# Patient Record
Sex: Female | Born: 1980 | Race: White | Hispanic: No | Marital: Married | Smoking: Never smoker
Health system: Southern US, Community
[De-identification: ages and names within clinical notes are randomized; demographics above are authoritative.]

## PROBLEM LIST (undated history)

## (undated) DIAGNOSIS — D649 Anemia, unspecified: Secondary | ICD-10-CM

## (undated) DIAGNOSIS — E559 Vitamin D deficiency, unspecified: Secondary | ICD-10-CM

## (undated) DIAGNOSIS — F429 Obsessive-compulsive disorder, unspecified: Secondary | ICD-10-CM

## (undated) DIAGNOSIS — R011 Cardiac murmur, unspecified: Secondary | ICD-10-CM

## (undated) DIAGNOSIS — F419 Anxiety disorder, unspecified: Secondary | ICD-10-CM

## (undated) DIAGNOSIS — K5090 Crohn's disease, unspecified, without complications: Secondary | ICD-10-CM

## (undated) HISTORY — DX: Anemia, unspecified: D64.9

## (undated) HISTORY — DX: Cardiac murmur, unspecified: R01.1

## (undated) HISTORY — DX: Anxiety disorder, unspecified: F41.9

## (undated) HISTORY — DX: Obsessive-compulsive disorder, unspecified: F42.9

## (undated) HISTORY — DX: Vitamin D deficiency, unspecified: E55.9

## (undated) HISTORY — DX: Crohn's disease, unspecified, without complications: K50.90

---

## 2002-10-07 HISTORY — PX: COLONOSCOPY: SHX174

## 2019-10-21 ENCOUNTER — Encounter: Payer: Self-pay | Admitting: Gastroenterology

## 2019-10-25 ENCOUNTER — Encounter: Payer: Self-pay | Admitting: Emergency Medicine

## 2019-10-26 ENCOUNTER — Other Ambulatory Visit (INDEPENDENT_AMBULATORY_CARE_PROVIDER_SITE_OTHER): Payer: Self-pay

## 2019-10-26 ENCOUNTER — Encounter: Payer: Self-pay | Admitting: Gastroenterology

## 2019-10-26 ENCOUNTER — Ambulatory Visit: Payer: Self-pay | Admitting: Gastroenterology

## 2019-10-26 VITALS — BP 114/62 | HR 80 | Temp 97.6°F | Ht 61.0 in | Wt 153.6 lb

## 2019-10-26 DIAGNOSIS — K50919 Crohn's disease, unspecified, with unspecified complications: Secondary | ICD-10-CM

## 2019-10-26 DIAGNOSIS — D509 Iron deficiency anemia, unspecified: Secondary | ICD-10-CM

## 2019-10-26 DIAGNOSIS — Z01818 Encounter for other preprocedural examination: Secondary | ICD-10-CM

## 2019-10-26 LAB — C-REACTIVE PROTEIN: CRP: <1 mg/dL (ref 0.5–20.0)

## 2019-10-26 LAB — SEDIMENTATION RATE: Sed Rate: 38 mm/hr — ABNORMAL HIGH (ref 0–20)

## 2019-10-26 MED ORDER — NA SULFATE-K SULFATE-MG SULF 17.5-3.13-1.6 GM/177ML PO SOLN: 1.0000 | ORAL | 0 refills | Status: DC

## 2019-10-26 NOTE — Progress Notes (Signed)
Referring Provider: Clydie Braun T Primary Care Physician:  Leilani Able, FNP  Reason for Consultation:  Crohn's disease   IMPRESSION:  Crohn's disease     - diagnosed at age 39    - not on treatment in over 15 years    - no history of extra-GI manifestations Change in bowel habits x 1 years Iron deficiency anemia without overt GI blood loss Regular NSAID use  History of Crohn's with loose stools. No blood. No thrombocytosis or hypoalbuminemia. Liver enzymes were normal. Recommend blood work, stool studies, and endoscopy for staging. Will review prior records from [facility]. If necessary, will proceed with CTE. Recommended that she avoid all NSAIDs.  Treatment options will need to be carefully considered given her lack of insurance. Will include pretreatment screening labs to determine her candidacy for biologics.   Profound iron deficiency anemia. Likely due to Crohn's, however, endoscopic evaluation with EGD and colonoscopy recommended to confirm the diagnosis and exclude concurrent etiologies. Recommend IV iron. Continue oral iron in the meantime.   Need to address preventative recommendations after confirming active disease.     PLAN: - ESR, CRP, fecal calprotectin, GI stool pathogen - HBsAg, HBcAb IgM, Quanterferon Gold - Feraheme day 0 and day 7 - Avoid all NSAIDs - EGD and Colonoscopy - Obtain records from [place] office visit after endoscopy  The nature of the procedure, as well as the risks, benefits, and alternatives were carefully and thoroughly reviewed with the patient. Ample time for discussion and questions allowed. The patient understood, was satisfied, and agreed to proceed.  Please see the "Patient Instructions" section for addition details about the plan.  HPI: Kelly Bryant is a 39 y.o. female referred by NP Alroy Dust for Crohn's disease. The history is obtained through the patient and referral  records provided by NP [facility]. She has anxiety, OCD,  and panic attacks. 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 Now home with her kids. Hours changed at work and she was unable to care for her kids and work.   Crohn's diagnosed at age 34 presenting with bloody diarrhea.  Had at least 13 colonoscopies.  No extra-GI manifestations of IBD Treated with folic acid, azathioprine, prednisone. Lost insurance around 83. Has not had any care or medical treatment since then. She was having constipation for many years. Then, a marked change in her bowel habits.  No formed BM in the last couple of years. Having 2-3 BM, post-prandial, urgent bowel movements at baseline. Increased frequency to 5-6 if she eats spicy foods.  No blood or mucous in the stools.  Mild, associated, non-radiating RLQ tenderness.  Finds Crohns symptoms are worse during menses. Reports heavy bleeding with menses.   No other identified exacerbating or relieving features.   PCP started her on budesonide 9 mg daily and ferrous sulfate. Could not afford the budesonide.  Stools are black since starting iron.  Uses Pamprin for pain, Aleve for leg cramps once or twice monthly.   Has a 37 year old and a 38 year old. Not planning to have any additional children.   Has not had the flu vaccine. No HAV, HBV, Pneumovax, Shingles, or HPV vaccines.  Labs 10/21/19: WBC 5.4, hgb 6.8, MCV 51, RDW 16.8, platelets 323; Normal CMP with normal liver enzymes; TSH 1.739  No known family history of colon cancer or polyps. No family history of uterine/endometrial cancer, pancreatic cancer or gastric/stomach cancer.   Past Medical History:  Diagnosis Date  . Anemia   . Anxiety   . Crohn's disease ([place])   . Heart murmur   . OCD (obsessive compulsive disorder)     Past Surgical History:  Procedure Laterality Date  . CESAREAN SECTION      Current Outpatient Medications  Medication Sig Dispense Refill  . FLUoxetine (PROZAC) 10 MG  capsule Take 10 mg by mouth at bedtime.    . ferrous sulfate 325 (65 FE) MG tablet Take 325 mg by mouth 3 (three) times daily with meals.    . Na Sulfate-K Sulfate-Mg Sulf 17.5-3.13-1.6 GM/177ML SOLN Take 1 kit by mouth as directed. 354 mL 0   No current facility-administered medications for this visit.    Allergies as of 10/26/2019  . (No Known Allergies)    Family History  Problem Relation Age of Onset  . Esophageal cancer Neg Hx   . Colon cancer Neg Hx   . Pancreatic cancer Neg Hx   . Stomach cancer Neg Hx   . Liver disease Neg Hx   . Inflammatory bowel disease Neg Hx     Social History   Socioeconomic History  . Marital status: Married    Spouse name: Not on file  . Number of children: Not on file  . Years of education: Not on file  . Highest education level: Not on file  Occupational History  . Not on file  Tobacco Use  . Smoking status: Never Smoker  . Smokeless tobacco: Never Used  Substance and Sexual Activity  . Alcohol use: Not Currently  . Drug use: Never  . Sexual activity: Not on file  Other Topics Concern  . Not on file  Social History Narrative  . Not on file   Social Determinants of Health   Financial Resource Strain:   . Difficulty of Paying Living Expenses: Not on file  Food Insecurity:   . Worried About Charity fundraiser in the Last Year: Not on file  . Ran Out of Food in the Last Year: Not on file  Transportation Needs:   . Lack of Transportation (Medical): Not on file  . Lack of Transportation (Non-Medical): Not on file  Physical Activity:   . Days of Exercise per Week: Not on file  . Minutes of Exercise per Session: Not on file  Stress:   . Feeling of Stress : Not on file  Social Connections:   . Frequency of Communication with Friends and Family: Not on file  . Frequency of Social Gatherings with Friends and Family: Not on file  . Attends Religious Services: Not on file  . Active Member of Clubs or Organizations: Not on file  .  Attends Archivist Meetings: Not on file  . Marital Status: Not on file  Intimate Partner Violence:   . Fear of Current or Ex-Partner: Not on file  . Emotionally Abused: Not on file  . Physically Abused: Not on file  . Sexually Abused: Not on file    Review of Systems: 12 system ROS is negative except as noted above.   Physical Exam: General:   Alert,  well-nourished, pleasant and cooperative in NAD Head:  Normocephalic and atraumatic. Eyes:  Sclera clear, no icterus.   Conjunctiva pink. Ears:  Normal auditory acuity. Nose:  No deformity, discharge,  or lesions. Mouth:  No deformity or lesions.   Neck:  Supple; no masses or thyromegaly. Lungs:  Clear throughout to auscultation.   No wheezes. Heart:  Regular rate and  rhythm; no murmurs. Abdomen:  Soft,nontender, nondistended, normal bowel sounds, no rebound or guarding. No hepatosplenomegaly.   Rectal:  Deferred  Msk:  Symmetrical. No boney deformities LAD: No inguinal or umbilical LAD Extremities:  No clubbing or edema. Neurologic:  Alert and  oriented x4;  grossly nonfocal Skin:  Intact without significant lesions or rashes. Psych:  Alert and cooperative. Normal mood and affect.    Orlinda Slomski L. Tarri Glenn, MD, MPH 10/26/2019, 9:29 PM

## 2019-10-26 NOTE — Patient Instructions (Addendum)
I have recommended some stool tests, blood tests, and endoscopy to stage your Crohn's and help [place] determine the best medications for treatment. Please go to the basement for the labwork.   Continue to take your oral iron. I am recommending IV iron given how low your iron stores are. Your appointment is scheduled for Thursday 10-28-19 at 8 am.   I recommend that you use sunscreen and sun-protective clothing whenever you are outside.  Avoid all non-steroidal anti-inflammatory medications such as Aleve, as this can worsen inflammatory bowel disease.   Please get the annual flu vaccine and ideally the Covid vaccine prior to your follow-up appointment.   Please see your gynecologist annually for cervical cancer screening  Keeping your bones healthy is important. I recommend that you take daily calcium and Vitamin D supplements. Weight bearing exercise is also good for your bones.   I recommend that you go to the [employer] website for additional information about achieving good health.

## 2019-10-28 ENCOUNTER — Other Ambulatory Visit: Payer: Self-pay

## 2019-10-28 ENCOUNTER — Ambulatory Visit (HOSPITAL_COMMUNITY)
Admission: RE | Admit: 2019-10-28 | Discharge: 2019-10-28 | Disposition: A | Discharge status: Home / Self Care | Payer: Self-pay | Source: Ambulatory Visit | Attending: Internal Medicine | Admitting: Internal Medicine

## 2019-10-28 DIAGNOSIS — D509 Iron deficiency anemia, unspecified: Secondary | ICD-10-CM

## 2019-10-28 DIAGNOSIS — K50919 Crohn's disease, unspecified, with unspecified complications: Secondary | ICD-10-CM

## 2019-10-28 LAB — QUANTIFERON-TB GOLD PLUS
Mitogen-NIL: >10 IU/mL
NIL: 0.04 IU/mL
QuantiFERON-TB Gold Plus: NEGATIVE
TB1-NIL: 0 IU/mL
TB2-NIL: <0 IU/mL

## 2019-10-28 LAB — HEPATITIS B SURFACE ANTIGEN: Hepatitis B Surface Ag: NONREACTIVE

## 2019-10-28 LAB — HEPATITIS B CORE ANTIBODY, IGM: Hep B C IgM: NONREACTIVE

## 2019-10-28 MED ORDER — SODIUM CHLORIDE 0.9 % IV SOLN
510.0000 mg | INTRAVENOUS | Status: DC
  Administered 2019-10-28: 09:00:00 510 mg via INTRAVENOUS
  Filled 2019-10-28: qty 17

## 2019-10-28 MED ORDER — SODIUM CHLORIDE 0.9 % IV SOLN
INTRAVENOUS | Status: DC | PRN
  Administered 2019-10-28: 250 mL via INTRAVENOUS

## 2019-10-28 NOTE — Discharge Instructions (Signed)

## 2019-10-28 NOTE — Progress Notes (Signed)
PATIENT CARE CENTER NOTE  Diagnosis:  Iron deficiency anemia, unspecified iron deficiency anemia type (D50.9)   Provider: Tressia Danas, MD   Procedure: IV Feraheme   Note: Patient received Feraheme infusion via PIV. Tolerated infusion well with no adverse reaction. Observed patient for 30 minutes post-infusion. Vital signs stable. Discharge instructions given. Patient to come back next week for second infusion. Alert, oriented and ambulatory at discharge.

## 2019-11-02 LAB — GASTROINTESTINAL PATHOGEN PANEL PCR
C. difficile Tox A/B, PCR: NOT DETECTED
Campylobacter, PCR: NOT DETECTED
Cryptosporidium, PCR: NOT DETECTED
E coli (ETEC) LT/ST PCR: NOT DETECTED
E coli (STEC) stx1/stx2, PCR: NOT DETECTED
E coli 0157, PCR: NOT DETECTED
Giardia lamblia, PCR: NOT DETECTED
Norovirus, PCR: NOT DETECTED
Rotavirus A, PCR: NOT DETECTED
Salmonella, PCR: NOT DETECTED
Shigella, PCR: NOT DETECTED

## 2019-11-04 ENCOUNTER — Other Ambulatory Visit: Payer: Self-pay

## 2019-11-04 ENCOUNTER — Ambulatory Visit (HOSPITAL_COMMUNITY)
Admission: RE | Admit: 2019-11-04 | Discharge: 2019-11-04 | Disposition: A | Discharge status: Home / Self Care | Payer: Self-pay | Source: Ambulatory Visit | Attending: Internal Medicine | Admitting: Internal Medicine

## 2019-11-04 LAB — CALPROTECTIN, FECAL: Calprotectin, Fecal: 718 ug/g — ABNORMAL HIGH (ref 0–120)

## 2019-11-04 MED ORDER — SODIUM CHLORIDE 0.9 % IV SOLN
INTRAVENOUS | Status: DC | PRN
  Administered 2019-11-04: 250 mL via INTRAVENOUS

## 2019-11-04 MED ORDER — SODIUM CHLORIDE 0.9 % IV SOLN
510.0000 mg | Freq: Once | INTRAVENOUS | Status: AC
  Administered 2019-11-04: 510 mg via INTRAVENOUS
  Filled 2019-11-04: qty 17

## 2019-11-04 NOTE — Progress Notes (Addendum)
Patient Care Center Note  Diagnosis: Iron deficiency anemia, unspecified iron deficiency anemia type (D50.9)  Provider: Tressia Danas, MD  Procedure: IV Feraheme  Note: Patient received IV feraheme infusion today via PIV. Pt tolerated infusion well, no adverse reaction noted. Observed patient for 30 minutes post transfusion, vital signs stable, IV removed without difficulty, RN reviewed discharge instructions with patient, all questions answered, pt A&Ox4 and ambulatory at time of discharge.

## 2019-11-04 NOTE — Discharge Instructions (Signed)

## 2019-11-10 ENCOUNTER — Other Ambulatory Visit: Payer: Self-pay | Admitting: Gastroenterology

## 2019-11-10 ENCOUNTER — Ambulatory Visit (INDEPENDENT_AMBULATORY_CARE_PROVIDER_SITE_OTHER): Payer: Self-pay

## 2019-11-10 DIAGNOSIS — Z1159 Encounter for screening for other viral diseases: Secondary | ICD-10-CM

## 2019-11-10 LAB — SARS CORONAVIRUS 2 (TAT 6-24 HRS): SARS Coronavirus 2: NEGATIVE

## 2019-11-12 ENCOUNTER — Other Ambulatory Visit: Payer: Self-pay

## 2019-11-12 ENCOUNTER — Ambulatory Visit (AMBULATORY_SURGERY_CENTER): Payer: Self-pay | Admitting: Gastroenterology

## 2019-11-12 ENCOUNTER — Encounter: Payer: Self-pay | Admitting: Gastroenterology

## 2019-11-12 VITALS — BP 103/55 | HR 50 | Temp 97.7°F | Resp 10 | Ht 61.0 in | Wt 153.0 lb

## 2019-11-12 DIAGNOSIS — K50919 Crohn's disease, unspecified, with unspecified complications: Secondary | ICD-10-CM

## 2019-11-12 DIAGNOSIS — K259 Gastric ulcer, unspecified as acute or chronic, without hemorrhage or perforation: Secondary | ICD-10-CM

## 2019-11-12 DIAGNOSIS — D509 Iron deficiency anemia, unspecified: Secondary | ICD-10-CM

## 2019-11-12 DIAGNOSIS — K3189 Other diseases of stomach and duodenum: Secondary | ICD-10-CM

## 2019-11-12 DIAGNOSIS — K2950 Unspecified chronic gastritis without bleeding: Secondary | ICD-10-CM

## 2019-11-12 DIAGNOSIS — K2980 Duodenitis without bleeding: Secondary | ICD-10-CM

## 2019-11-12 HISTORY — PX: COLONOSCOPY: SHX174

## 2019-11-12 MED ORDER — SODIUM CHLORIDE 0.9 % IV SOLN: 500.0000 mL | Freq: Once | INTRAVENOUS | Status: DC

## 2019-11-12 MED ORDER — PANTOPRAZOLE SODIUM 40 MG PO TBEC: 40.0000 mg | DELAYED_RELEASE_TABLET | Freq: Every day | ORAL | 0 refills | Status: AC

## 2019-11-12 NOTE — Progress Notes (Signed)
Called to room to assist during endoscopic procedure.  Patient ID and intended procedure confirmed with present staff. Received instructions for my participation in the procedure from the performing physician.  

## 2019-11-12 NOTE — Progress Notes (Signed)
Pt's states no medical or surgical changes since previsit or office visit.  Temp LC Vitals DT 

## 2019-11-12 NOTE — Patient Instructions (Signed)
Start Protonix 40mg  daily before breakfast for 8 weeks.  Avoid all non-steroidal anti-inflammatory drugs (NSAIDs).   Handouts provided on gastritis and NSAIDs and peptic ulcers.    YOU HAD AN ENDOSCOPIC PROCEDURE TODAY AT THE [facility]:   Refer to the procedure report that was given to you for any specific questions about what was found during the examination.  If the procedure report does not answer your questions, please call your gastroenterologist to clarify.  If you requested that your care partner not be given the details of your procedure findings, then the procedure report has been included in a sealed envelope for you to review at your convenience later.  YOU SHOULD EXPECT: Some feelings of bloating in the abdomen. Passage of more gas than usual.  Walking can help get rid of the air that was put into your GI tract during the procedure and reduce the bloating. If you had a lower endoscopy (such as a colonoscopy or flexible sigmoidoscopy) you may notice spotting of blood in your stool or on the toilet paper. If you underwent a bowel prep for your procedure, you may not have a normal bowel movement for a few days.  Please Note:  You might notice some irritation and congestion in your nose or some drainage.  This is from the oxygen used during your procedure.  There is no need for concern and it should clear up in a day or so.  SYMPTOMS TO REPORT IMMEDIATELY:   Following lower endoscopy (colonoscopy or flexible sigmoidoscopy):  Excessive amounts of blood in the stool  Significant tenderness or worsening of abdominal pains  Swelling of the abdomen that is new, acute  Fever of 100F or higher   Following upper endoscopy (EGD)  Vomiting of blood or coffee ground material  New chest pain or pain under the shoulder blades  Painful or persistently difficult swallowing  New shortness of breath  Fever of 100F or higher  Black, tarry-looking stools  For urgent or emergent  issues, a gastroenterologist can be reached at any hour by calling [phone].   DIET:  We do recommend a small meal at first, but then you may proceed to your regular diet.  Drink plenty of fluids but you should avoid alcoholic beverages for 24 hours.  ACTIVITY:  You should plan to take it easy for the rest of today and you should NOT DRIVE or use heavy machinery until tomorrow (because of the sedation medicines used during the test).    FOLLOW UP: Our staff will call the number listed on your records 48-72 hours following your procedure to check on you and address any questions or concerns that you may have regarding the information given to you following your procedure. If we do not reach you, we will leave a message.  We will attempt to reach you two times.  During this call, we will ask if you have developed any symptoms of COVID 19. If you develop any symptoms (ie: fever, flu-like symptoms, shortness of breath, cough etc.) before then, please call [phone].  If you test positive for Covid 19 in the 2 weeks post procedure, please call and report this information to [phone].    If any biopsies were taken you will be contacted by phone or by letter within the next 1-3 weeks.  Please call us at [phone] if you have not heard about the biopsies in 3 weeks.    SIGNATURES/CONFIDENTIALITY: You and/or your care partner have signed paperwork which  will be entered into your electronic medical record.  These signatures attest to the fact that that the information above on your After Visit Summary has been reviewed and is understood.  Full responsibility of the confidentiality of this discharge information lies with you and/or your care-partner.

## 2019-11-12 NOTE — Op Note (Signed)
[facility] Patient Name: Kelly Bryant Procedure Date: 11/12/2019 1:59 PM MRN: [id number] Endoscopist: Thornton Park MD, MD Age: 39 Referring MD:  Date of Birth: June 16, 1981 Gender: Female Account #: [id number] Procedure:                Colonoscopy Indications:              Unexplained iron deficiency anemia, Follow-up of                            Crohn's disease of the colon                           Crohn's disease                           - diagnosed at age 55                           - not on treatment in over 54 years                           - no history of extra-GI manifestations                           Change in bowel habits x 1 years                           Iron deficiency anemia without overt GI blood loss                           Regular NSAID use Medicines:                Monitored Anesthesia Care Procedure:                Pre-Anesthesia Assessment:                           - Prior to the procedure, a History and Physical                            was performed, and patient medications and                            allergies were reviewed. The patient's tolerance of                            previous anesthesia was also reviewed. The risks                            and benefits of the procedure and the sedation                            options and risks were discussed with the patient.                            All questions were answered, and informed consent  was obtained. Prior Anticoagulants: The patient has                            taken no previous anticoagulant or antiplatelet                            agents. ASA Grade Assessment: II - A patient with                            mild systemic disease. After reviewing the risks                            and benefits, the patient was deemed in                            satisfactory condition to undergo the procedure.                           After obtaining  informed consent, the colonoscope                            was passed under direct vision. Throughout the                            procedure, the patient's blood pressure, pulse, and                            oxygen saturations were monitored continuously. The                            Colonoscope was introduced through the anus and                            advanced to the 1 cm into the ileum. A second                            forward view of the right colon. The colonoscopy                            was performed without difficulty. The patient                            tolerated the procedure well. The quality of the                            bowel preparation was good. Scope In: 2:16:07 PM Scope Out: 2:32:05 PM Scope Withdrawal Time: 0 hours 14 minutes 2 seconds  Total Procedure Duration: 0 hours 15 minutes 58 seconds  Findings:                 The perianal and digital rectal examinations were                            normal.  The colon (entire examined portion) appeared                            normal. Biopsies were taken from the cecum, right                            colon, transverse, left colon and rectum with a                            cold forceps for histology. Estimated blood loss                            was minimal.                           The terminal ileum was fixed. I was unable to                            evaluate beyond the most distal TI. The distal                            mucosa in the terminal ileum was friable with                            contact bleeding. Biopsies were taken with a cold                            forceps for histology. Estimated blood loss was                            minimal.                           The exam was otherwise without abnormality on                            direct and retroflexion views. Complications:            No immediate complications. Estimated blood loss:                             Minimal. Estimated Blood Loss:     Estimated blood loss was minimal. Impression:               - The entire examined colon is normal. Biopsied.                           - Friability with contact bleeding in the terminal                            ileum. Biopsied.                           - The examination was otherwise normal on direct  and retroflexion views. Recommendation:           - Patient has a contact number available for                            emergencies. The signs and symptoms of potential                            delayed complications were discussed with the                            patient. Return to normal activities tomorrow.                            Written discharge instructions were provided to the                            patient.                           - Resume previous diet.                           - Continue present medications.                           - Await pathology results.                           - Repeat colonoscopy date to be determined after                            pending pathology results are reviewed for                            surveillance.                           - Follow-up in the office to review these results. Tressia Danas MD, MD 11/12/2019 2:52:34 PM This report has been signed electronically.

## 2019-11-12 NOTE — Op Note (Signed)
[facility] Patient Name: Kelly Bryant Procedure Date: 11/12/2019 2:00 PM MRN: [id number] Endoscopist: Tressia Danas MD, MD Age: 39 Referring MD:  Date of Birth: [birth year] Gender: Female Account #: [id number] Procedure:                Upper GI endoscopy Indications:              Unexplained iron deficiency anemia Medicines:                Monitored Anesthesia Care Procedure:                Pre-Anesthesia Assessment:                           - Prior to the procedure, a History and Physical                            was performed, and patient medications and                            allergies were reviewed. The patient's tolerance of                            previous anesthesia was also reviewed. The risks                            and benefits of the procedure and the sedation                            options and risks were discussed with the patient.                            All questions were answered, and informed consent                            was obtained. Prior Anticoagulants: The patient has                            taken no previous anticoagulant or antiplatelet                            agents. ASA Grade Assessment: II - A patient with                            mild systemic disease. After reviewing the risks                            and benefits, the patient was deemed in                            satisfactory condition to undergo the procedure.                           After obtaining informed consent, the endoscope was  passed under direct vision. Throughout the                            procedure, the patient's blood pressure, pulse, and                            oxygen saturations were monitored continuously. The                            Endoscope was introduced through the mouth, and                            advanced to the third part of duodenum. The upper                            GI endoscopy was  accomplished without difficulty.                            The patient tolerated the procedure well. Scope In: Scope Out: Findings:                 The esophagus was normal.                           One non-bleeding superficial gastric ulcer with no                            stigmata of bleeding was found in the gastric                            antrum. The lesion was 6 mm in largest dimension.                            Biopsies were taken with a cold forceps for                            histology. Estimated blood loss was minimal.                           Diffuse mild inflammation characterized by                            erythema, friability and granularity was found in                            the gastric body and in the gastric antrum.                            Biopsies were taken with a cold forceps for                            histology. Estimated blood loss was minimal.  Diffuse mildly erythematous mucosa without active                            bleeding and with no stigmata of bleeding was found                            in the duodenal bulb. Biopsies were taken with a                            cold forceps for histology. Estimated blood loss                            was minimal. Complications:            No immediate complications. Estimated blood loss:                            Minimal. Estimated Blood Loss:     Estimated blood loss was minimal. Impression:               - Normal esophagus.                           - Non-bleeding gastric ulcer with no stigmata of                            bleeding. Biopsied.                           - Gastritis. Biopsied.                           - Erythematous duodenopathy. Biopsied. Recommendation:           - Patient has a contact number available for                            emergencies. The signs and symptoms of potential                            delayed complications were discussed  with the                            patient. Return to normal activities tomorrow.                            Written discharge instructions were provided to the                            patient.                           - Resume previous diet.                           - Continue present medications. Start Protonix 40  mg QAM x 8 weeks.                           - Avoid all NSAIDs.                           - Await pathology results.                           - Proceed with colonoscopy as previously planned. Thornton Park MD, MD 11/12/2019 2:44:14 PM This report has been signed electronically.

## 2019-11-12 NOTE — Progress Notes (Signed)
To PACU, VSS. Report to [email or web address] 

## 2019-11-16 ENCOUNTER — Telehealth: Payer: Self-pay | Admitting: *Deleted

## 2019-11-16 NOTE — Telephone Encounter (Signed)
  Follow up Call-  Call back number 11/12/2019  Post procedure Call Back phone  # [phone]  Permission to leave phone message Yes    No answer for post procedure call back. Left message for patient to call with questions or concerns.

## 2019-11-24 ENCOUNTER — Other Ambulatory Visit: Payer: Self-pay | Admitting: *Deleted

## 2019-11-24 ENCOUNTER — Ambulatory Visit: Payer: Self-pay | Admitting: Gastroenterology

## 2019-11-24 DIAGNOSIS — K50919 Crohn's disease, unspecified, with unspecified complications: Secondary | ICD-10-CM

## 2019-12-02 ENCOUNTER — Other Ambulatory Visit: Payer: Self-pay | Admitting: *Deleted

## 2019-12-02 DIAGNOSIS — K50919 Crohn's disease, unspecified, with unspecified complications: Secondary | ICD-10-CM

## 2019-12-16 ENCOUNTER — Ambulatory Visit (HOSPITAL_COMMUNITY)
Admission: RE | Admit: 2019-12-16 | Discharge: 2019-12-16 | Disposition: A | Discharge status: Home / Self Care | Payer: Self-pay | Source: Ambulatory Visit | Attending: Gastroenterology | Admitting: Gastroenterology

## 2019-12-16 ENCOUNTER — Other Ambulatory Visit: Payer: Self-pay

## 2019-12-16 DIAGNOSIS — K50919 Crohn's disease, unspecified, with unspecified complications: Secondary | ICD-10-CM | POA: Insufficient documentation

## 2019-12-16 MED ORDER — SODIUM CHLORIDE (PF) 0.9 % IJ SOLN
INTRAMUSCULAR | Status: AC
  Filled 2019-12-16: qty 50

## 2019-12-16 MED ORDER — IOHEXOL 300 MG/ML  SOLN
100.0000 mL | Freq: Once | INTRAMUSCULAR | Status: AC | PRN
  Administered 2019-12-16: 100 mL via INTRAVENOUS

## 2019-12-16 MED ORDER — BARIUM SULFATE 0.1 % PO SUSP
ORAL | Status: AC
  Filled 2019-12-16: qty 3

## 2019-12-30 ENCOUNTER — Encounter: Payer: Self-pay | Admitting: Gastroenterology

## 2019-12-30 ENCOUNTER — Ambulatory Visit (INDEPENDENT_AMBULATORY_CARE_PROVIDER_SITE_OTHER): Payer: Self-pay | Admitting: Gastroenterology

## 2019-12-30 ENCOUNTER — Other Ambulatory Visit (INDEPENDENT_AMBULATORY_CARE_PROVIDER_SITE_OTHER): Payer: Self-pay

## 2019-12-30 VITALS — BP 94/60 | HR 80 | Temp 98.6°F | Ht 61.5 in | Wt 156.4 lb

## 2019-12-30 DIAGNOSIS — K50919 Crohn's disease, unspecified, with unspecified complications: Secondary | ICD-10-CM

## 2019-12-30 DIAGNOSIS — D509 Iron deficiency anemia, unspecified: Secondary | ICD-10-CM

## 2019-12-30 LAB — C-REACTIVE PROTEIN: CRP: <1 mg/dL (ref 0.5–20.0)

## 2019-12-30 LAB — CBC
HCT: 37.9 % (ref 36.0–46.0)
Hemoglobin: 12.7 g/dL (ref 12.0–15.0)
MCHC: 33.5 g/dL (ref 30.0–36.0)
MCV: 77.7 fl — ABNORMAL LOW (ref 78.0–100.0)
Platelets: 218 10*3/uL (ref 150.0–400.0)
RBC: 4.88 Mil/uL (ref 3.87–5.11)
RDW: 15.3 % (ref 11.5–15.5)
WBC: 6.8 10*3/uL (ref 4.0–10.5)

## 2019-12-30 LAB — SEDIMENTATION RATE: Sed Rate: 29 mm/hr — ABNORMAL HIGH (ref 0–20)

## 2019-12-30 LAB — IRON: Iron: 35 ug/dL — ABNORMAL LOW (ref 42–145)

## 2019-12-30 LAB — FERRITIN: Ferritin: 14 ng/mL (ref 10.0–291.0)

## 2019-12-30 MED ORDER — BUDESONIDE 3 MG PO CPEP: 9.0000 mg | ORAL_CAPSULE | Freq: Every day | ORAL | 1 refills | Status: AC

## 2019-12-30 NOTE — Progress Notes (Signed)
Referring Provider: Clydie Braun T Primary Care Physician:  Leilani Able, FNP  Chief complaint:  Crohn's disease   IMPRESSION:  Crohn's disease     - diagnosed at age 39    - not on treatment in over 29 years    - no history of extra-GI manifestations    - Labs 10/28/19: fecal calprotectin 718, ESR 38, CRP less than 1    - Colon/EGD showed biopsy-identified ileitis, normal colon biopsies    - CTE 12/16/19: distal TI irregularity and pseudosacculation; narrowing at the IC valve NSAID-related gastric ulcer    - biopsies negative for H pylori Change in bowel habits x 1 years Iron deficiency anemia without overt GI blood loss    -Symptomatic improvement with IV iron x2 Recent NSAID use Leiomyomata seen on recent CT scan  Crohn's disease: Unclear if recent symptoms and endoscopy and imaging findings reflect active IBD or NSAIDs. Ileal biopsies do not show classic features of IBD. She has stopped using all NSAIDs. Discussed trial of budesonide prior to considering other treatments. Also had a more extensive discussion about other Crohn's treatment options. Plan repeat colonoscopy with more extensive evaluation of the TI after NSAID washout and treatment with budesonide. We have been unable to locate old records from [facility] including pathology results from the time of her initial diagnosis  Profound iron deficiency anemia. Repeat labs today to follow her response to two IV infusions. Referral to GYN in the event that the leiomyomata on CT is contributing to her anemia and that the anemia is not due to Crohn's. Continue oral iron at this time. Suspect she will need additional IV iron  Need to address preventative recommendations after confirming active disease.   Gastric ulcer: Off NSAIDs. Completed PPI therapy. Consider follow-up EGD to document healing.    PLAN: - ESR, CRP, fecal calprotectin - Trial of budesonide '9mg'$  daily; 9 QD four weeks, but not more than eight weeks.  Taper by 3 mg increments every two weeks. - CBC, iron, ferritin - Follow-up with GYN re: leiomyomata - Consider Entyvio if not responding and additional evidence supports Crohn's - Follow-up office visit in 8 weeks, will repeat inflammatory labs and fecal calprotectin at that time  HPI: Kelly Bryant is a 39 y.o. female with small bowel Crohn's disease. Initially seen in consultation 10/26/19. Had EGD and colonoscopy 11/12/19 followed by CTE. Returns in scheduled follow-up. The history is obtained through the patient and referral records provided by NP [facility]. She has anxiety, OCD,  and panic attacks. 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 Now home with her kids. Hours changed at work and she was unable to care for her kids and work.   Crohn's diagnosed at age 55 presenting with bloody diarrhea and abdominal cramping.  Had at least 13 colonoscopies.  No extra-GI manifestations of IBD Treated with folic acid, azathioprine, prednisone. Lost insurance around 51. Has not had any care or medical treatment since then. She was having constipation for many years. Then, a marked change in her bowel habits.  No formed BM in the last couple of years. Having 2-3 BM, post-prandial, urgent bowel movements at baseline. Increased frequency to 5-6 if she eats spicy foods.  No blood or mucous in the stools.  Mild, associated, non-radiating RLQ tenderness.  Finds Crohns symptoms are worse during menses. Reports heavy bleeding with menses.   No other identified exacerbating or relieving features.   PCP started her on budesonide 9 mg daily and  ferrous sulfate. Could not afford the budesonide.  Stools are black since starting iron.  Was using Pamprin for pain, Aleve for leg cramps once or twice monthly. Stopped after her initial consultation.   Has not had the flu vaccine. No HAV, HBV, Pneumovax, Shingles, or HPV vaccines.  Received IV iron x 2 after her initial consultation. Feeling better after the  iron infusion. More energy. Fatigued during periods.   Labs 10/28/19: CRP <1, ESR 38, hemoglobin 12.7, MCV 77.7, RDW 15.3, platelets 218.  QuantiFERON-TB negative.  Hep B surface antigen negative, hep B core antibody IgM negative, GI stool pathogen panel negative, fecal calprotectin 718  Colonoscopy 11/12/2019 showed no active Crohn's disease in the colon or most distal terminal ileum.  I was unable to intubate the ileum beyond the most distal portion. EGD 11/12/19: Showed a 6 mm nonbleeding gastric antral ulcer, gastritis, and duodenitis.  Duodenal biopsies showed duodenitis.  Gastric biopsies showed reactive gastropathy without H. pylori.  Distal terminal ileal biopsies showed focally active chronic active ileitis with no granulomas or dysplasia.  Random colon biopsies were normal.  CTEnterography 12/16/19: IMPRESSION: 1. Pattern of enteritis and colitis with chronic features could be seen in the setting of Crohn's disease as described above. Most striking findings of this process are seen within the distal ileum where there is mucosal irregularity and pseudo sacculation. 2. Suggestion of narrowing at the ileocecal valve. 3. No evidence of abscess, fistula, or obstruction. 4. Antral thickening may be present, correlate with any evidence of gastritis. 5. Leiomyomata in the uterus with signs of previous abdominal wall surgery perhaps related to prior C-section.  Overall feeling better. Stools are more formed but still not solid twice daily. No blood or mucous.  No abdominal pain except after she eats bread when she develops constipation. No pain or cramping like she has had with Crohn's in the past. She wonders if her symptoms are more related to NSAIDs and symptomatic anemia because they are so mild compared to prior Crohn's symptoms.  Good appetite.  Stable weight.  Past Medical History:  Diagnosis Date  . Anemia   . Anxiety   . Crohn's disease ([place])   . Heart murmur   . OCD (obsessive  compulsive disorder)   . Vitamin D deficiency     Past Surgical History:  Procedure Laterality Date  . CESAREAN SECTION    . COLONOSCOPY  11/12/2019  . COLONOSCOPY  2004   since 39 years of age, several colonoscopies over the years    Current Outpatient Medications  Medication Sig Dispense Refill  . budesonide (ENTOCORT EC) 3 MG 24 hr capsule Take 1 capsule by mouth every morning.    . ferrous sulfate 325 (65 FE) MG tablet Take 325 mg by mouth 3 (three) times daily with meals.    Marland Kitchen FLUoxetine (PROZAC) 10 MG capsule Take 10 mg by mouth at bedtime.    . pantoprazole (PROTONIX) 40 MG tablet Take 1 tablet (40 mg total) by mouth daily with breakfast. 56 tablet 0  . Vitamin D, Ergocalciferol, (DRISDOL) 1.25 MG (50000 UNIT) CAPS capsule Take 50,000 Units by mouth every 7 (seven) days.     No current facility-administered medications for this visit.    Allergies as of 12/30/2019  . (No Known Allergies)    Family History  Problem Relation Age of Onset  . Esophageal cancer Neg Hx   . Colon cancer Neg Hx   . Pancreatic cancer Neg Hx   . Stomach cancer Neg Hx   .  Liver disease Neg Hx   . Inflammatory bowel disease Neg Hx   . Colon polyps Neg Hx     Social History   Socioeconomic History  . Marital status: Married    Spouse name: Not on file  . Number of children: Not on file  . Years of education: Not on file  . Highest education level: Not on file  Occupational History  . Not on file  Tobacco Use  . Smoking status: Never Smoker  . Smokeless tobacco: Never Used  Substance and Sexual Activity  . Alcohol use: Not Currently  . Drug use: Never  . Sexual activity: Not on file  Other Topics Concern  . Not on file  Social History Narrative  . Not on file   Social Determinants of Health   Financial Resource Strain:   . Difficulty of Paying Living Expenses:   Food Insecurity:   . Worried About Charity fundraiser in the Last Year:   . Arboriculturist in the Last Year:     Transportation Needs:   . Film/video editor (Medical):   Marland Kitchen Lack of Transportation (Non-Medical):   Physical Activity:   . Days of Exercise per Week:   . Minutes of Exercise per Session:   Stress:   . Feeling of Stress :   Social Connections:   . Frequency of Communication with Friends and Family:   . Frequency of Social Gatherings with Friends and Family:   . Attends Religious Services:   . Active Member of Clubs or Organizations:   . Attends Archivist Meetings:   Marland Kitchen Marital Status:   Intimate Partner Violence:   . Fear of Current or Ex-Partner:   . Emotionally Abused:   Marland Kitchen Physically Abused:   . Sexually Abused:     Physical Exam: General:   Alert,  well-nourished, pleasant and cooperative in NAD Head:  Normocephalic and atraumatic. Eyes:  Sclera clear, no icterus.   Conjunctiva pink. Ears:  Normal auditory acuity. Nose:  No deformity, discharge,  or lesions. Mouth:  No deformity or lesions.   Neck:  Supple; no masses or thyromegaly. Abdomen:  Soft, nontender, nondistended, normal bowel sounds, no rebound or guarding. No hepatosplenomegaly.   Rectal:  Deferred  Msk:  Symmetrical. No boney deformities LAD: No inguinal or umbilical LAD Extremities:  No clubbing or edema. Neurologic:  Alert and  oriented x4;  grossly nonfocal Skin:  Intact without significant lesions or rashes. Psych:  Alert and cooperative. Normal mood and affect.    Lea Baine L. Tarri Glenn, MD, MPH 12/30/2019, 9:41 AM

## 2019-12-30 NOTE — Patient Instructions (Addendum)
Your provider has requested that you go to the basement level for lab work before leaving today. Press "B" on the elevator. The lab is located at the first door on the left as you exit the elevator.  We have sent the following medications to your pharmacy for you to pick up at your convenience: Budesonide 9 mg, taper by 3 mg every two weeks.    Please have repeat labs one week before your follow up appointment.   I value your feedback and thank you for entrusting [place] with your care. If you get a [place] patient survey, I would appreciate you taking the time to let us know about your experience today. Thank you!   Due to recent changes in healthcare laws, you may see the results of your imaging and laboratory studies on MyChart before your provider has had a chance to review them.  We understand that in some cases there may be results that are confusing or concerning to you. Not all laboratory results come back in the same time frame and the provider may be waiting for multiple results in order to interpret others.  Please give [place] 48 hours in order for your provider to thoroughly review all the results before contacting the office for clarification of your results.

## 2019-12-31 ENCOUNTER — Other Ambulatory Visit: Payer: Self-pay

## 2019-12-31 DIAGNOSIS — D509 Iron deficiency anemia, unspecified: Secondary | ICD-10-CM

## 2019-12-31 DIAGNOSIS — K50919 Crohn's disease, unspecified, with unspecified complications: Secondary | ICD-10-CM

## 2020-01-03 ENCOUNTER — Encounter: Payer: Self-pay | Admitting: *Deleted

## 2020-01-03 ENCOUNTER — Telehealth: Payer: Self-pay | Admitting: *Deleted

## 2020-01-03 NOTE — Telephone Encounter (Signed)
-----   Message from Tressia Danas, MD sent at 12/30/2019  1:12 PM EDT ----- Results reviewed with the patient during her recent office visit. However, with her anemia, I want to be sure that the leiomyomata in the uterus is not contributing. Who is her GYN? I would like for her to follow-up with that doctor to review. I would also like to send a copy of these results to that doctor. Thanks.

## 2020-01-03 NOTE — Telephone Encounter (Signed)
See result note on 3/29.

## 2020-01-05 LAB — CALPROTECTIN, FECAL: Calprotectin, Fecal: 255 ug/g — ABNORMAL HIGH (ref 0–120)

## 2020-01-06 ENCOUNTER — Other Ambulatory Visit: Payer: Self-pay

## 2020-01-06 DIAGNOSIS — K50919 Crohn's disease, unspecified, with unspecified complications: Secondary | ICD-10-CM

## 2020-01-12 ENCOUNTER — Other Ambulatory Visit: Payer: Self-pay

## 2020-01-12 DIAGNOSIS — R935 Abnormal findings on diagnostic imaging of other abdominal regions, including retroperitoneum: Secondary | ICD-10-CM

## 2020-02-01 ENCOUNTER — Telehealth: Payer: Self-pay | Admitting: Gastroenterology

## 2020-02-01 NOTE — Telephone Encounter (Signed)
Order is in epic, pt aware.

## 2020-03-16 ENCOUNTER — Ambulatory Visit: Payer: Self-pay | Admitting: Gastroenterology

## 2020-10-20 IMAGING — CT CT ENTEROGRAPHY (ABD-PELV W/ CM)
2 of 5 series · 15 of 46 positions shown, 17 images · IV contrast (omnipaque)
Comparison: None

CLINICAL DATA: History of colonoscopy, evaluate for Crohn's
disease.

EXAM:
CT ABDOMEN AND PELVIS WITH CONTRAST (ENTEROGRAPHY)
TECHNIQUE: Multidetector CT of the abdomen and pelvis during bolus
administration of intravenous contrast. Negative oral contrast was
given.
CONTRAST:  100mL OMNIPAQUE IOHEXOL 300 MG/ML  SOLN

[Series 4: thins · axial · 0.76mm/px · z∈[+1341,+1773]mm · 12 of 474 slices shown, 14 images]
[im 21/474  soft-tissue]
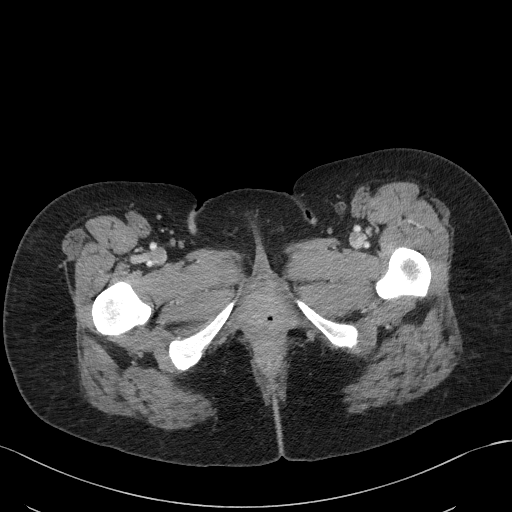
[im 21/474  bone]
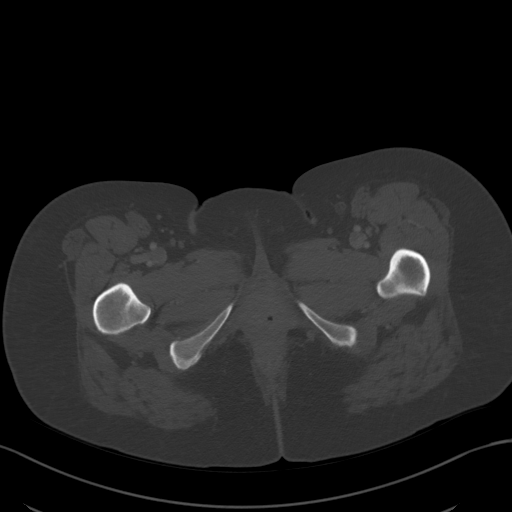
[im 62/474  soft-tissue]
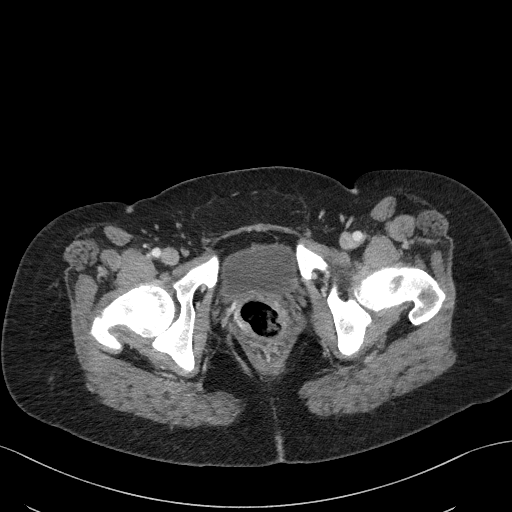
[im 103/474  soft-tissue]
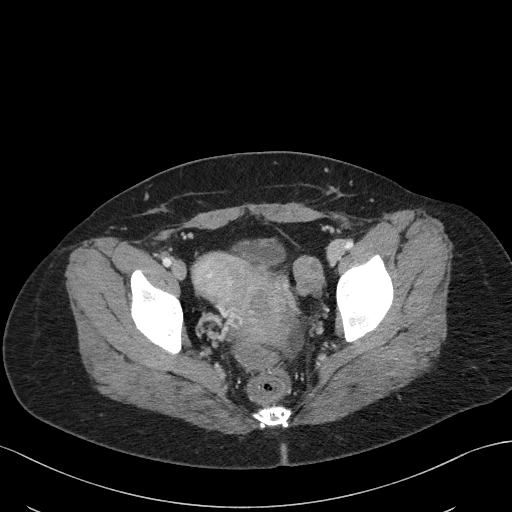
[im 144/474  soft-tissue]
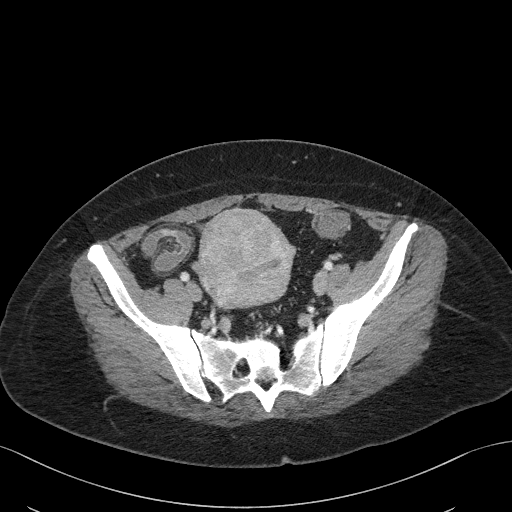
[im 186/474  soft-tissue]
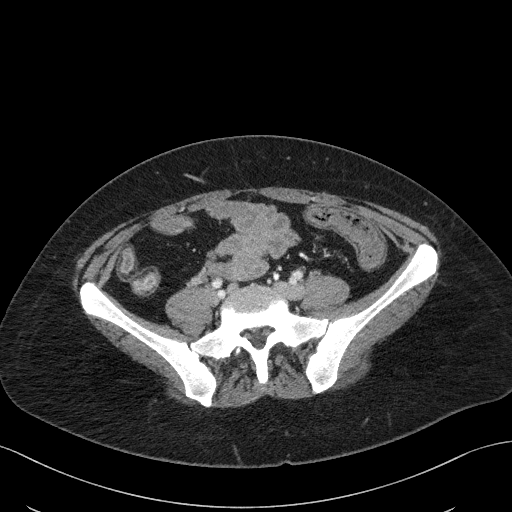
[im 227/474  soft-tissue]
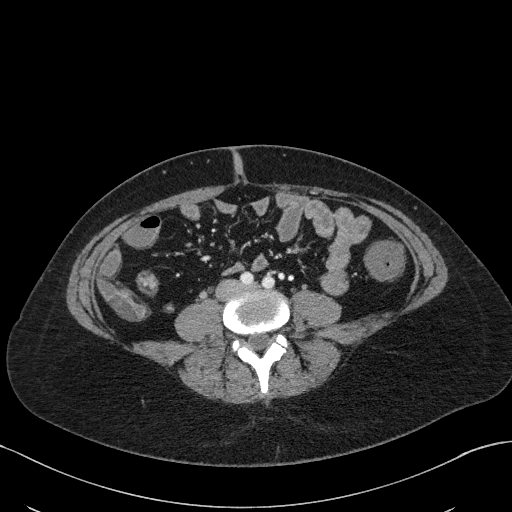
[im 247/474  soft-tissue]
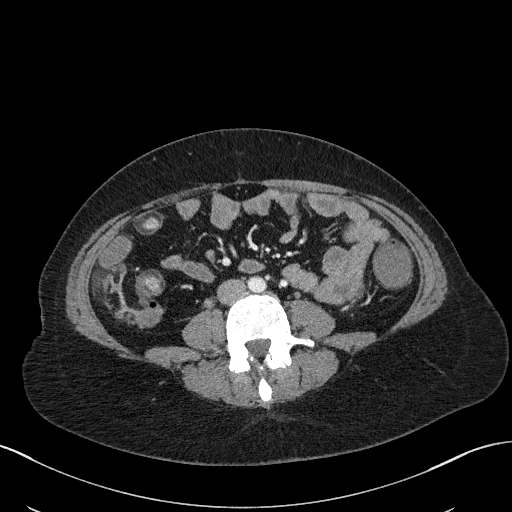
[im 288/474  soft-tissue]
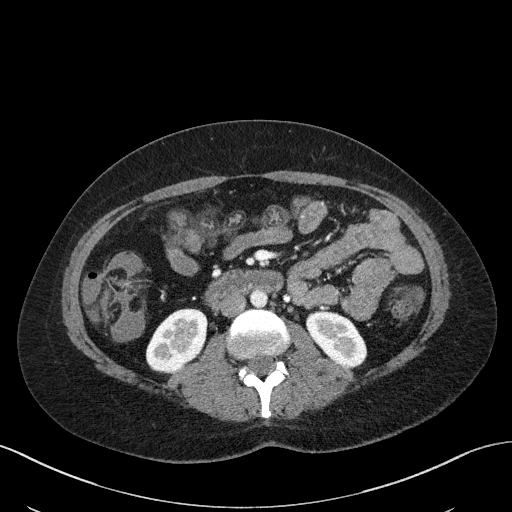
[im 330/474  soft-tissue]
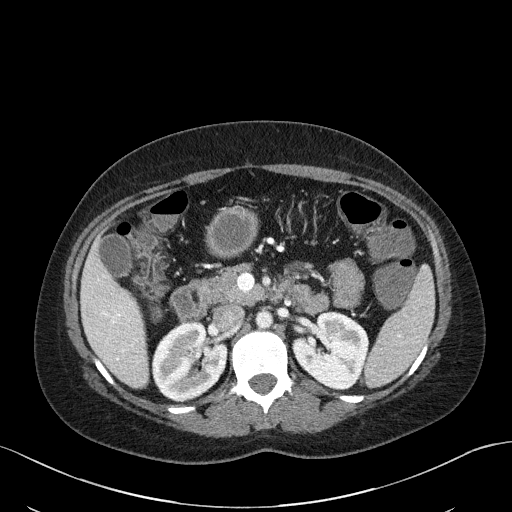
[im 330/474  bone]
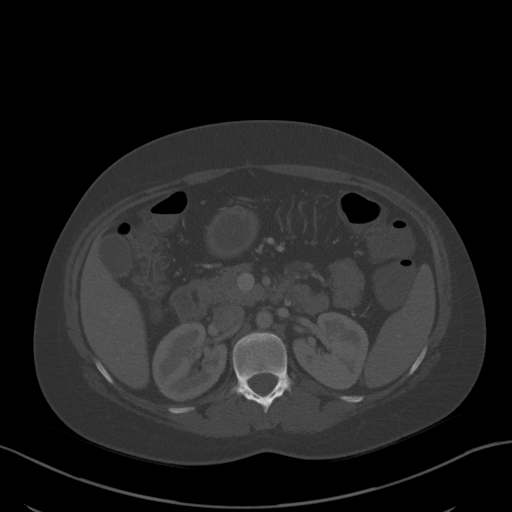
[im 371/474  soft-tissue]
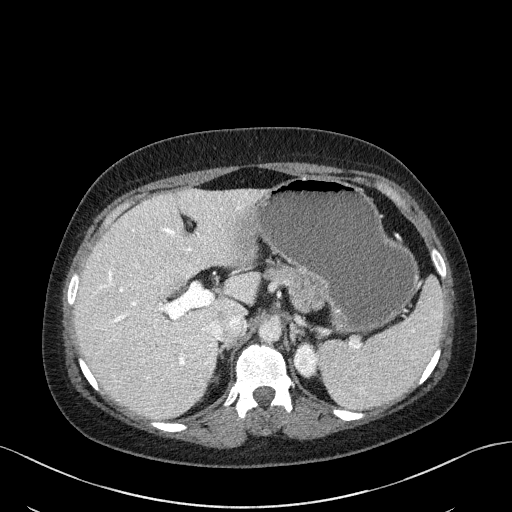
[im 412/474  soft-tissue]
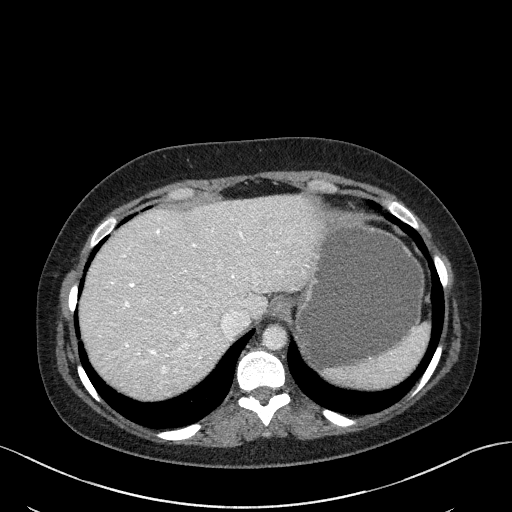
[im 453/474  soft-tissue]
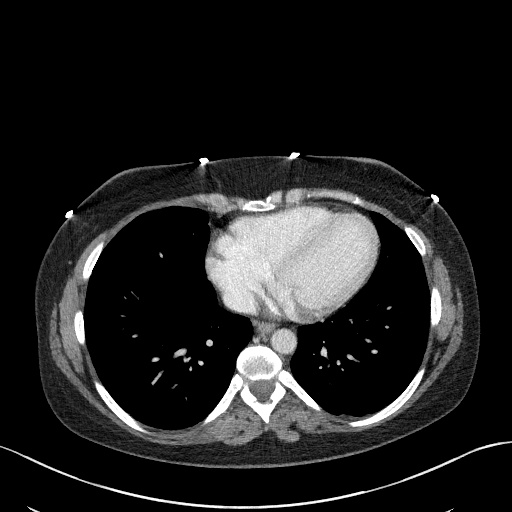

[Series 6: coronal · coronal · 0.74mm/px · 3 of 70 slices shown]
[im 24/70  soft-tissue]
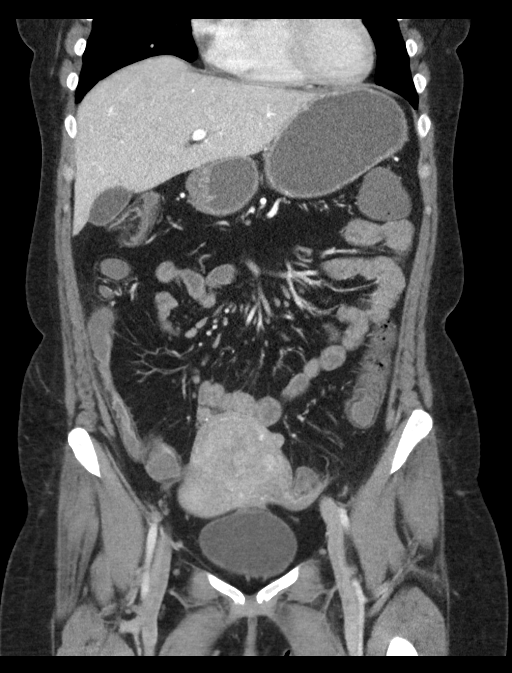
[im 31/70  soft-tissue]
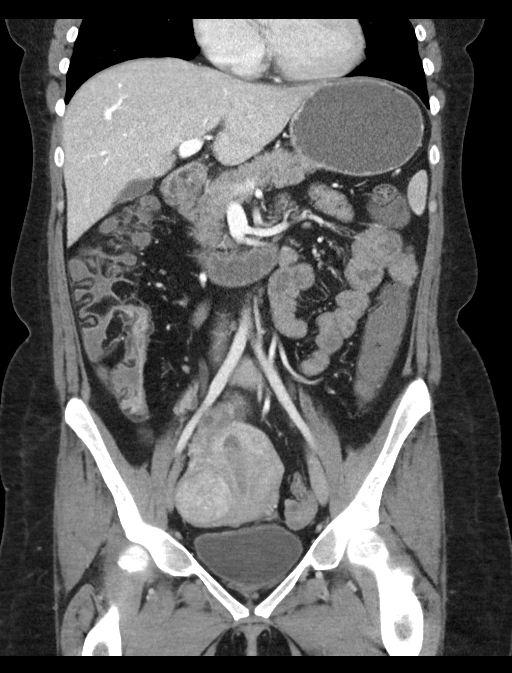
[im 39/70  soft-tissue]
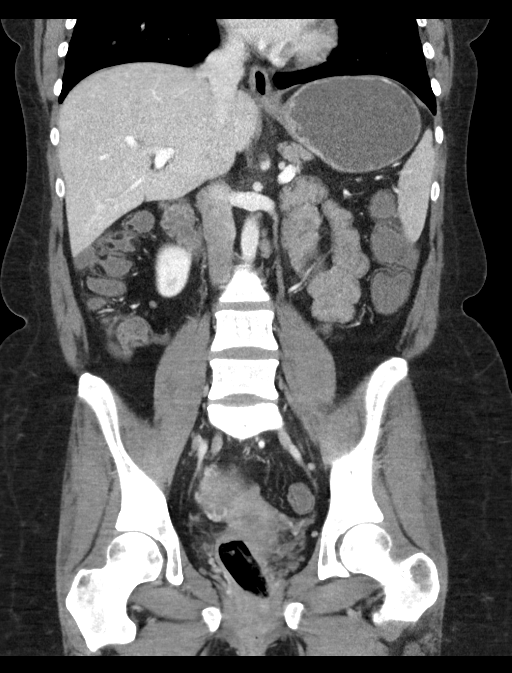

[15 of 46 positions shown; findings below may reference images not displayed]

FINDINGS: Lower chest: Basilar atelectasis. No signs of consolidation or
evidence of pleural effusion.

Hepatobiliary: Liver is normal. Portal vein is patent. No signs of
biliary ductal dilation or pericholecystic inflammation.

Pancreas: No mass, inflammatory changes, or other significant
abnormality.

Spleen: Normal spleen.

Adrenals/Urinary Tract: Adrenal glands are normal.

No signs of hydronephrosis. Normal appearance of urinary bladder.

Stomach/Bowel: Signs of mild antral thickening or peristaltic
activity. Limited distension of the jejunum in particular with only
mild distension of distal small bowel.

Signs of submucosal fat in the distal ileum and in the proximal and
mid colon. Ahaustral appearance of the distal colon.

Signs of this pseudo sacculation in the distal ileum. Abnormal
appearing ileum is approximately 40-45 cm in length with some fatty
proliferation about the ileocecal region. The irregular enhancement
of the mucosa. Suggestion of narrowing at the ileocecal valve.
Example of pseudo sacculation best seen on image 18 of series 6, the
coronal data set in the right hemiabdomen. No signs of fistula,
abscess or obstruction

Vascular/Lymphatic: Normal caliber abdominal aorta with patent
abdominal vessels. No evidence of abdominal adenopathy.

No signs of pelvic lymphadenopathy. Perineum is incompletely imaged
but there are no signs of perianal disease within imaged portions.

Reproductive: Leiomyomata within the uterus. These are approximately
5 cm in greatest dimension and there are 2 1 within the fundus and
another within the right uterus that make up the dominant leiomyoma
likely with a smaller fibroid along the left parametrium.

Other: Signs of previous abdominal wall surgery with the transverse
lower abdominal/pelvic incision likely related to prior C-section.

Musculoskeletal: No signs of acute bone finding or evidence of
destructive bone process.
IMPRESSION: 1. Pattern of enteritis and colitis with chronic features could be
seen in the setting of Crohn's disease as described above. Most
striking findings of this process are seen within the distal ileum
where there is mucosal irregularity and pseudo sacculation.
2. Suggestion of narrowing at the ileocecal valve.
3. No evidence of abscess, fistula, or obstruction.
4. Antral thickening may be present, correlate with any evidence of
gastritis.
5. Leiomyomata in the uterus with signs of previous abdominal wall
surgery perhaps related to prior C-section.
